# Patient Record
Sex: Male | Born: 1975 | Race: Black or African American | Hispanic: No | Marital: Married | Smoking: Never smoker
Health system: Southern US, Community
[De-identification: ages and names within clinical notes are randomized; demographics above are authoritative.]

## PROBLEM LIST (undated history)

## (undated) ENCOUNTER — Emergency Department (HOSPITAL_BASED_OUTPATIENT_CLINIC_OR_DEPARTMENT_OTHER): Payer: 59 | Source: Home / Self Care

## (undated) DIAGNOSIS — S86012A Strain of left Achilles tendon, initial encounter: Secondary | ICD-10-CM

## (undated) DIAGNOSIS — I82409 Acute embolism and thrombosis of unspecified deep veins of unspecified lower extremity: Secondary | ICD-10-CM

## (undated) HISTORY — PX: APPENDECTOMY: SHX54

---

## 2008-04-01 ENCOUNTER — Emergency Department (HOSPITAL_COMMUNITY): Admission: EM | Admit: 2008-04-01 | Discharge: 2008-04-01 | Payer: Self-pay | Admitting: Family Medicine

## 2010-09-29 ENCOUNTER — Emergency Department (INDEPENDENT_AMBULATORY_CARE_PROVIDER_SITE_OTHER): Payer: 59

## 2010-09-29 ENCOUNTER — Emergency Department (HOSPITAL_BASED_OUTPATIENT_CLINIC_OR_DEPARTMENT_OTHER)
Admission: EM | Admit: 2010-09-29 | Discharge: 2010-09-29 | Disposition: A | Discharge status: Home / Self Care | Payer: 59 | Attending: Emergency Medicine | Admitting: Emergency Medicine

## 2010-09-29 DIAGNOSIS — X500XXA Overexertion from strenuous movement or load, initial encounter: Secondary | ICD-10-CM

## 2010-09-29 DIAGNOSIS — Y9367 Activity, basketball: Secondary | ICD-10-CM | POA: Insufficient documentation

## 2010-09-29 DIAGNOSIS — W219XXA Striking against or struck by unspecified sports equipment, initial encounter: Secondary | ICD-10-CM | POA: Insufficient documentation

## 2010-09-29 DIAGNOSIS — S63659A Sprain of metacarpophalangeal joint of unspecified finger, initial encounter: Secondary | ICD-10-CM

## 2010-09-29 DIAGNOSIS — S6390XA Sprain of unspecified part of unspecified wrist and hand, initial encounter: Secondary | ICD-10-CM | POA: Insufficient documentation

## 2012-05-23 ENCOUNTER — Ambulatory Visit
Admission: RE | Admit: 2012-05-23 | Discharge: 2012-05-23 | Disposition: A | Discharge status: Home / Self Care | Payer: Worker's Compensation | Source: Ambulatory Visit | Attending: Family Medicine | Admitting: Family Medicine

## 2012-05-23 ENCOUNTER — Other Ambulatory Visit: Payer: Self-pay | Admitting: Family Medicine

## 2012-05-23 DIAGNOSIS — W19XXXA Unspecified fall, initial encounter: Secondary | ICD-10-CM

## 2014-04-01 ENCOUNTER — Encounter (HOSPITAL_BASED_OUTPATIENT_CLINIC_OR_DEPARTMENT_OTHER): Payer: Self-pay | Admitting: Emergency Medicine

## 2014-04-01 ENCOUNTER — Emergency Department (HOSPITAL_BASED_OUTPATIENT_CLINIC_OR_DEPARTMENT_OTHER)
Admission: EM | Admit: 2014-04-01 | Discharge: 2014-04-01 | Disposition: A | Discharge status: Home / Self Care | Payer: 59 | Attending: Emergency Medicine | Admitting: Emergency Medicine

## 2014-04-01 ENCOUNTER — Ambulatory Visit (HOSPITAL_BASED_OUTPATIENT_CLINIC_OR_DEPARTMENT_OTHER)
Admit: 2014-04-01 | Discharge: 2014-04-01 | Disposition: A | Discharge status: Home / Self Care | Payer: 59 | Source: Ambulatory Visit | Attending: Emergency Medicine | Admitting: Emergency Medicine

## 2014-04-01 DIAGNOSIS — M79604 Pain in right leg: Secondary | ICD-10-CM

## 2014-04-01 DIAGNOSIS — Z87828 Personal history of other (healed) physical injury and trauma: Secondary | ICD-10-CM | POA: Insufficient documentation

## 2014-04-01 DIAGNOSIS — M79609 Pain in unspecified limb: Secondary | ICD-10-CM | POA: Insufficient documentation

## 2014-04-01 DIAGNOSIS — Z86718 Personal history of other venous thrombosis and embolism: Secondary | ICD-10-CM | POA: Insufficient documentation

## 2014-04-01 DIAGNOSIS — Z7901 Long term (current) use of anticoagulants: Secondary | ICD-10-CM | POA: Diagnosis not present

## 2014-04-01 HISTORY — DX: Strain of left Achilles tendon, initial encounter: S86.012A

## 2014-04-01 HISTORY — DX: Acute embolism and thrombosis of unspecified deep veins of unspecified lower extremity: I82.409

## 2014-04-01 NOTE — ED Notes (Signed)
Pt currently being treated for blood clot in left calf(also has torn achilles tendon on left side and in cast. Pt now having pain in right upper quad with pain into his groin.

## 2014-04-01 NOTE — ED Provider Notes (Signed)
CSN: [id number]     Arrival date & time 04/01/14  0027 History   First MD Initiated Contact with Patient 04/01/14 0102     Chief Complaint  Patient presents with  . Leg Pain     (Consider location/radiation/quality/duration/timing/severity/associated sxs/prior Treatment) Patient is a 38 y.o. male presenting with leg pain. The history is provided by the patient.  Leg Pain Location:  Leg Injury: no   Leg location:  R leg Pain details:    Radiates to:  Does not radiate   Onset quality:  Gradual   Timing:  Constant   Progression:  Unchanged Chronicity:  New Foreign body present:  No foreign bodies Relieved by:  Nothing Associated symptoms: no muscle weakness   Risk factors: no obesity   Has achilles rupture on the left and is casted on the left and developed a DVT.  Study on the left was done at [facility].  Is on xarelto but now having medial thigh pain on the right and came because he wants a vascular study of the RLE.    Past Medical History  Diagnosis Date  . DVT (deep venous thrombosis)   . Achilles rupture, left    History reviewed. No pertinent past surgical history. History reviewed. No pertinent family history. History  Substance Use Topics  . Smoking status: Never Smoker   . Smokeless tobacco: Not on file  . Alcohol Use: No    Review of Systems  Respiratory: Negative for chest tightness and shortness of breath.   Cardiovascular: Negative for chest pain and palpitations.  Musculoskeletal: Positive for arthralgias.  All other systems reviewed and are negative.     Allergies  Review of patient's allergies indicates no known allergies.  Home Medications   Prior to Admission medications   Medication Sig Start Date End Date Taking? Authorizing Provider  Rivaroxaban (XARELTO) 15 MG TABS tablet Take 15 mg by mouth 2 (two) times daily with a meal.   Yes Historical Provider, MD   BP 140/65  Pulse 80  Temp(Src) 98.3 F (36.8 C) (Oral)  Resp 16  Wt 204 lb (92.534  kg)  SpO2 99% Physical Exam  Constitutional: He is oriented to person, place, and time. He appears well-developed and well-nourished. No distress.  HENT:  Head: Normocephalic and atraumatic.  Mouth/Throat: Oropharynx is clear and moist.  Eyes: Conjunctivae are normal. Pupils are equal, round, and reactive to light.  Neck: Normal range of motion. Neck supple.  Cardiovascular: Normal rate, regular rhythm and intact distal pulses.   Pulmonary/Chest: Effort normal and breath sounds normal. He has no wheezes. He has no rales.  Abdominal: Soft. Bowel sounds are normal. There is no tenderness.  Musculoskeletal: Normal range of motion. He exhibits no edema.  No cords of the RLE achilles intact  Neurological: He is alert and oriented to person, place, and time.  Skin: Skin is warm and dry.  Psychiatric: He has a normal mood and affect.    ED Course  Procedures (including critical care time) Labs Review Labs Reviewed - No data to display  Imaging Review No results found.   EKG Interpretation None      MDM   Final diagnoses:  Leg pain, diffuse, right   There is no indication for DDimer as patient has an active clot on the left.  Clotting studies to see if patient has a coagulopathy will be inconclusive as patient is on xarelto for more than 48 hours.   EDP offered lovenox as patient is concerned about  symptoms on xarelto.  Patient is refusing Lovenox.  Came in solely to get ultrasound of RLE.  EDP politely explained that there are not vascular ultrasounds at this hour and apologized that they had not been informed of this.  Outpatient vascular ultrasound scheduled for am but patient is still refusing lovenox.      Jasmine AweApril K Brittanni Cariker-Rasch, MD 04/01/14 [phone]

## 2014-04-11 ENCOUNTER — Ambulatory Visit: Payer: 59 | Attending: Orthopedic Surgery | Admitting: Physical Therapy

## 2014-04-18 ENCOUNTER — Ambulatory Visit: Payer: 59 | Attending: Orthopedic Surgery | Admitting: Rehabilitation

## 2014-04-18 DIAGNOSIS — M66369 Spontaneous rupture of flexor tendons, unspecified lower leg: Secondary | ICD-10-CM | POA: Insufficient documentation

## 2014-04-18 DIAGNOSIS — IMO0001 Reserved for inherently not codable concepts without codable children: Secondary | ICD-10-CM | POA: Insufficient documentation

## 2014-04-18 DIAGNOSIS — M25579 Pain in unspecified ankle and joints of unspecified foot: Secondary | ICD-10-CM | POA: Insufficient documentation

## 2014-04-18 DIAGNOSIS — M25673 Stiffness of unspecified ankle, not elsewhere classified: Secondary | ICD-10-CM | POA: Diagnosis not present

## 2014-04-18 DIAGNOSIS — M25676 Stiffness of unspecified foot, not elsewhere classified: Secondary | ICD-10-CM | POA: Insufficient documentation

## 2014-04-18 DIAGNOSIS — R269 Unspecified abnormalities of gait and mobility: Secondary | ICD-10-CM | POA: Insufficient documentation

## 2014-04-26 ENCOUNTER — Ambulatory Visit: Payer: 59 | Admitting: Rehabilitation

## 2014-04-27 ENCOUNTER — Ambulatory Visit: Payer: 59

## 2014-04-30 ENCOUNTER — Ambulatory Visit: Payer: 59 | Admitting: Rehabilitation

## 2014-04-30 DIAGNOSIS — IMO0001 Reserved for inherently not codable concepts without codable children: Secondary | ICD-10-CM | POA: Diagnosis not present

## 2014-05-03 ENCOUNTER — Ambulatory Visit: Payer: 59 | Admitting: Rehabilitation

## 2014-05-03 DIAGNOSIS — IMO0001 Reserved for inherently not codable concepts without codable children: Secondary | ICD-10-CM | POA: Diagnosis not present

## 2014-05-07 ENCOUNTER — Ambulatory Visit: Payer: 59 | Admitting: Physical Therapy

## 2014-05-07 DIAGNOSIS — IMO0001 Reserved for inherently not codable concepts without codable children: Secondary | ICD-10-CM | POA: Diagnosis not present

## 2014-05-10 ENCOUNTER — Ambulatory Visit: Payer: 59 | Admitting: Rehabilitation

## 2014-05-14 ENCOUNTER — Ambulatory Visit: Payer: 59 | Admitting: Physical Therapy

## 2014-05-14 DIAGNOSIS — IMO0001 Reserved for inherently not codable concepts without codable children: Secondary | ICD-10-CM | POA: Diagnosis not present

## 2014-05-17 ENCOUNTER — Ambulatory Visit: Payer: 59 | Attending: Orthopedic Surgery | Admitting: Rehabilitation

## 2014-05-17 DIAGNOSIS — M25672 Stiffness of left ankle, not elsewhere classified: Secondary | ICD-10-CM | POA: Insufficient documentation

## 2014-05-17 DIAGNOSIS — R269 Unspecified abnormalities of gait and mobility: Secondary | ICD-10-CM | POA: Insufficient documentation

## 2014-05-17 DIAGNOSIS — M25572 Pain in left ankle and joints of left foot: Secondary | ICD-10-CM | POA: Insufficient documentation

## 2014-05-17 DIAGNOSIS — M66872 Spontaneous rupture of other tendons, left ankle and foot: Secondary | ICD-10-CM | POA: Insufficient documentation

## 2014-05-21 ENCOUNTER — Ambulatory Visit: Payer: 59 | Admitting: Physical Therapy

## 2014-05-21 DIAGNOSIS — M66872 Spontaneous rupture of other tendons, left ankle and foot: Secondary | ICD-10-CM | POA: Diagnosis present

## 2014-05-21 DIAGNOSIS — R269 Unspecified abnormalities of gait and mobility: Secondary | ICD-10-CM | POA: Diagnosis not present

## 2014-05-21 DIAGNOSIS — M25572 Pain in left ankle and joints of left foot: Secondary | ICD-10-CM | POA: Diagnosis not present

## 2014-05-21 DIAGNOSIS — M25672 Stiffness of left ankle, not elsewhere classified: Secondary | ICD-10-CM | POA: Diagnosis not present

## 2014-05-24 ENCOUNTER — Ambulatory Visit: Payer: 59 | Admitting: Rehabilitation

## 2014-05-24 DIAGNOSIS — M66872 Spontaneous rupture of other tendons, left ankle and foot: Secondary | ICD-10-CM | POA: Diagnosis not present

## 2014-05-28 ENCOUNTER — Ambulatory Visit: Payer: 59 | Admitting: Rehabilitation

## 2014-05-28 DIAGNOSIS — M66872 Spontaneous rupture of other tendons, left ankle and foot: Secondary | ICD-10-CM | POA: Diagnosis not present

## 2014-05-31 ENCOUNTER — Ambulatory Visit: Payer: 59 | Admitting: Rehabilitation

## 2014-06-05 ENCOUNTER — Ambulatory Visit: Payer: 59 | Admitting: Rehabilitation

## 2014-06-07 ENCOUNTER — Ambulatory Visit: Payer: 59 | Admitting: Physical Therapy

## 2014-06-07 DIAGNOSIS — M66872 Spontaneous rupture of other tendons, left ankle and foot: Secondary | ICD-10-CM | POA: Diagnosis not present

## 2014-06-11 ENCOUNTER — Ambulatory Visit: Payer: 59 | Admitting: Rehabilitation

## 2014-06-11 DIAGNOSIS — M66872 Spontaneous rupture of other tendons, left ankle and foot: Secondary | ICD-10-CM | POA: Diagnosis not present

## 2014-06-14 ENCOUNTER — Ambulatory Visit: Payer: 59 | Admitting: Rehabilitation

## 2014-06-14 DIAGNOSIS — M66872 Spontaneous rupture of other tendons, left ankle and foot: Secondary | ICD-10-CM | POA: Diagnosis not present

## 2014-06-18 ENCOUNTER — Ambulatory Visit: Payer: 59 | Attending: Orthopedic Surgery | Admitting: Physical Therapy

## 2014-06-18 DIAGNOSIS — R269 Unspecified abnormalities of gait and mobility: Secondary | ICD-10-CM | POA: Insufficient documentation

## 2014-06-18 DIAGNOSIS — M66872 Spontaneous rupture of other tendons, left ankle and foot: Secondary | ICD-10-CM | POA: Insufficient documentation

## 2014-06-18 DIAGNOSIS — M25672 Stiffness of left ankle, not elsewhere classified: Secondary | ICD-10-CM | POA: Insufficient documentation

## 2014-06-18 DIAGNOSIS — M25572 Pain in left ankle and joints of left foot: Secondary | ICD-10-CM | POA: Insufficient documentation

## 2014-06-21 ENCOUNTER — Ambulatory Visit: Payer: 59 | Admitting: Rehabilitation

## 2014-06-21 DIAGNOSIS — M25672 Stiffness of left ankle, not elsewhere classified: Secondary | ICD-10-CM | POA: Diagnosis not present

## 2014-06-21 DIAGNOSIS — R269 Unspecified abnormalities of gait and mobility: Secondary | ICD-10-CM | POA: Diagnosis not present

## 2014-06-21 DIAGNOSIS — M66872 Spontaneous rupture of other tendons, left ankle and foot: Secondary | ICD-10-CM | POA: Diagnosis not present

## 2014-06-21 DIAGNOSIS — M25572 Pain in left ankle and joints of left foot: Secondary | ICD-10-CM | POA: Diagnosis not present

## 2014-06-25 ENCOUNTER — Ambulatory Visit: Payer: 59 | Admitting: Rehabilitation

## 2014-06-25 DIAGNOSIS — M66872 Spontaneous rupture of other tendons, left ankle and foot: Secondary | ICD-10-CM | POA: Diagnosis not present

## 2014-06-27 ENCOUNTER — Ambulatory Visit: Payer: 59 | Admitting: Physical Therapy

## 2014-06-28 ENCOUNTER — Ambulatory Visit: Payer: 59 | Admitting: Physical Therapy

## 2014-07-11 ENCOUNTER — Ambulatory Visit: Payer: 59 | Admitting: Physical Therapy

## 2014-08-07 ENCOUNTER — Ambulatory Visit: Payer: 59

## 2016-01-01 IMAGING — US US EXTREM LOW VENOUS*R*
1 series · 13 of 24 positions shown · non-contrast
Comparison: None.

CLINICAL DATA: Right thigh soreness. History of left lower
extremity deep venous thrombosis.



[Series 1: us extrem low venous*right* · 13 of 41 slices shown]
[im 1/41]
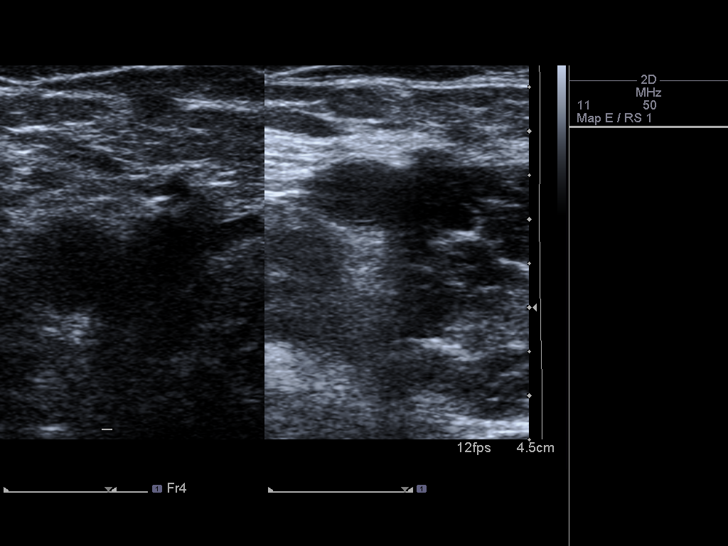
[im 4/41]
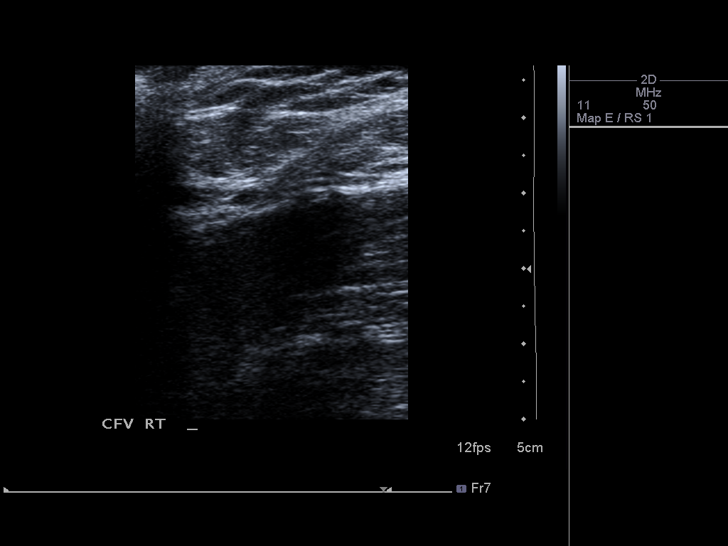
[im 7/41]
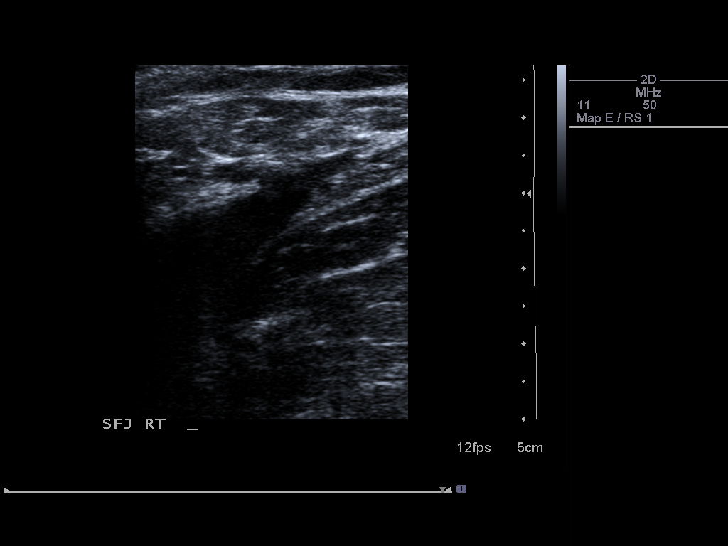
[im 11/41]
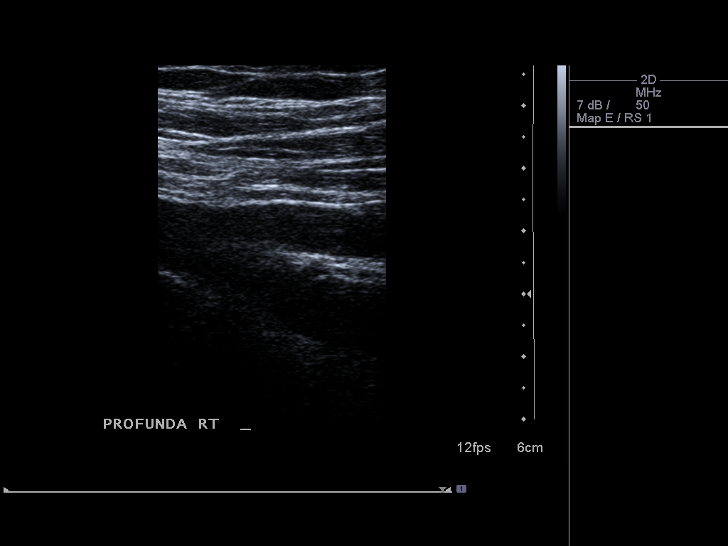
[im 14/41]
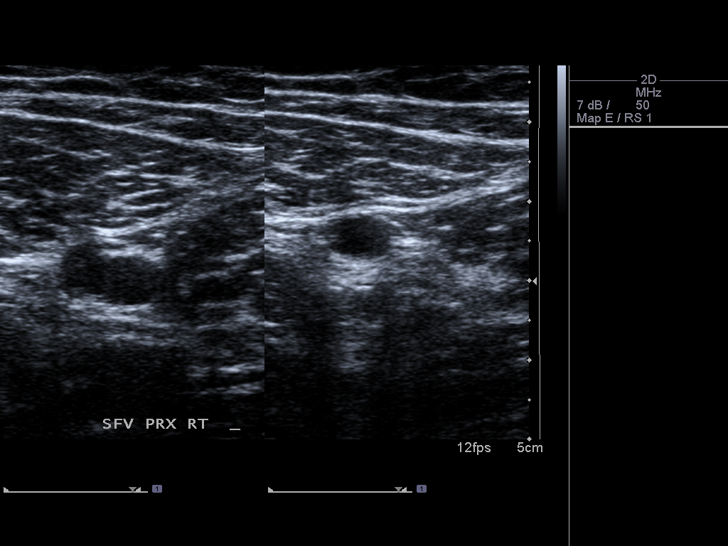
[im 18/41]
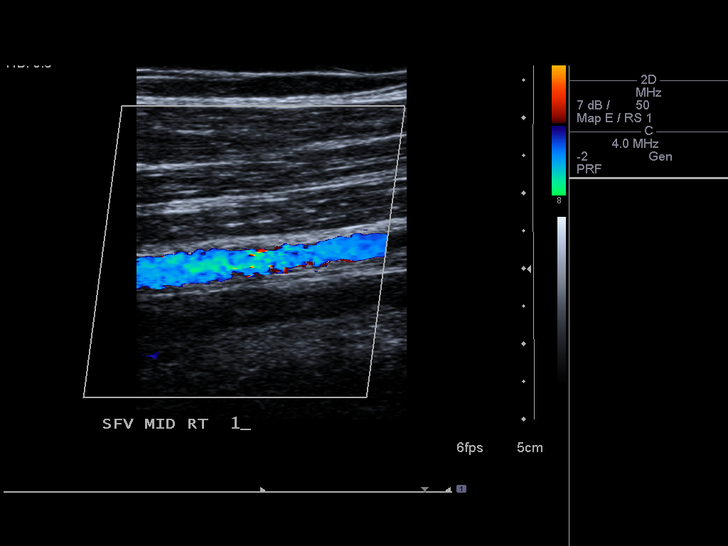
[im 21/41]
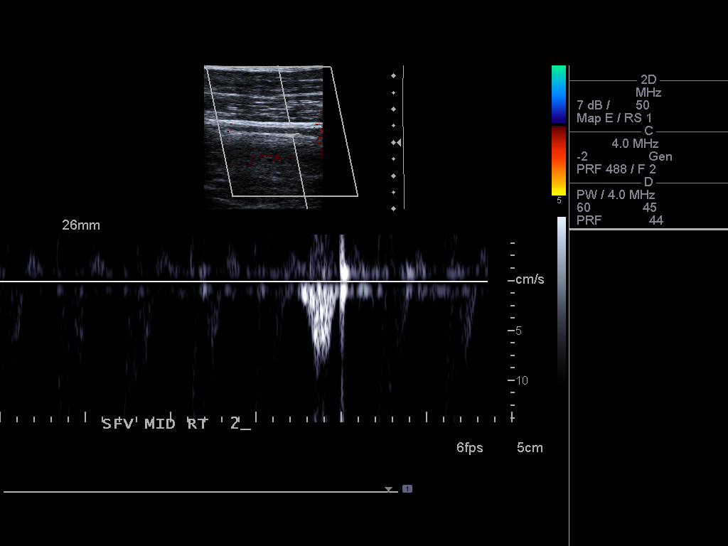
[im 23/41]
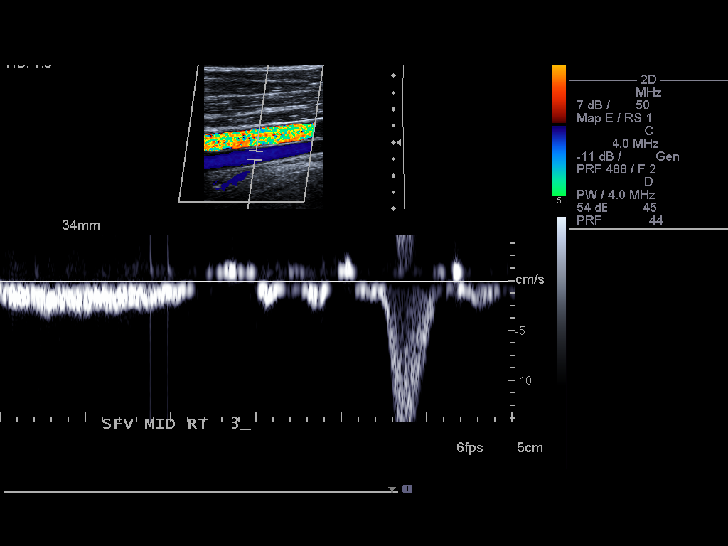
[im 27/41]
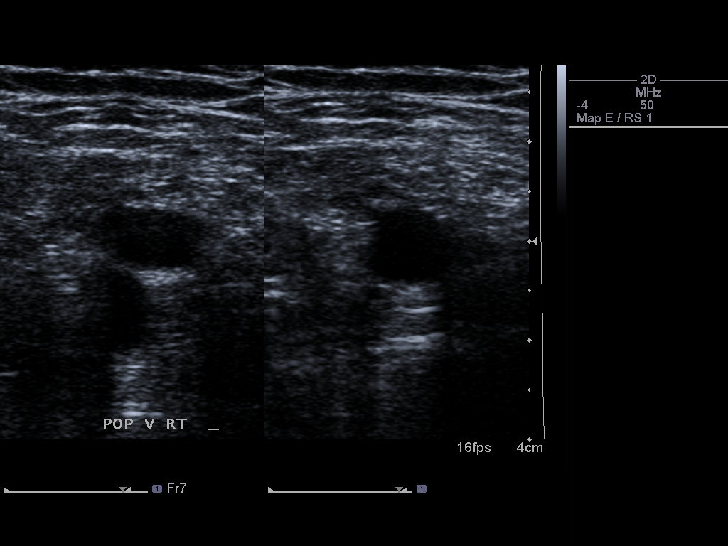
[im 30/41]
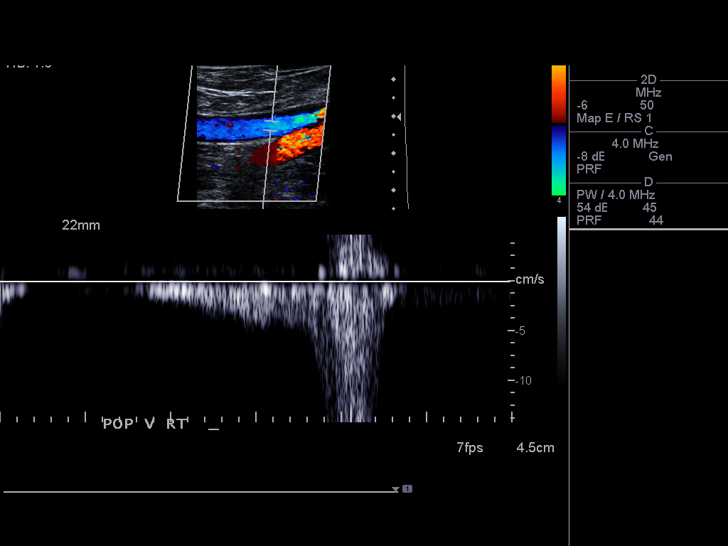
[im 34/41]
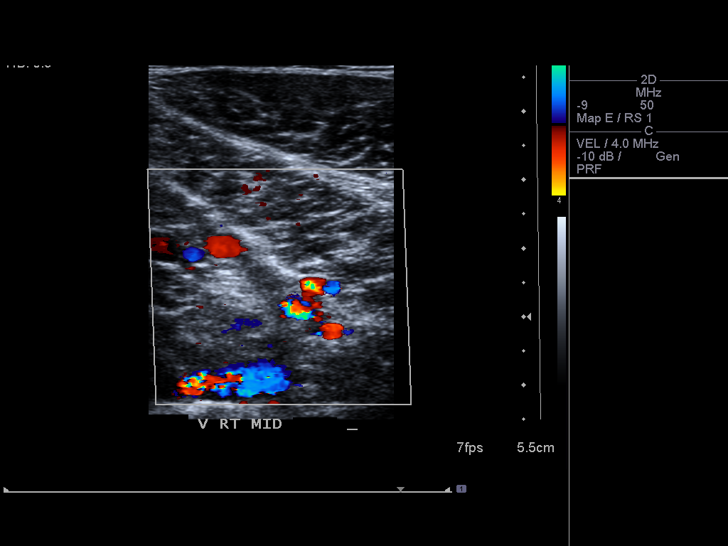
[im 37/41]
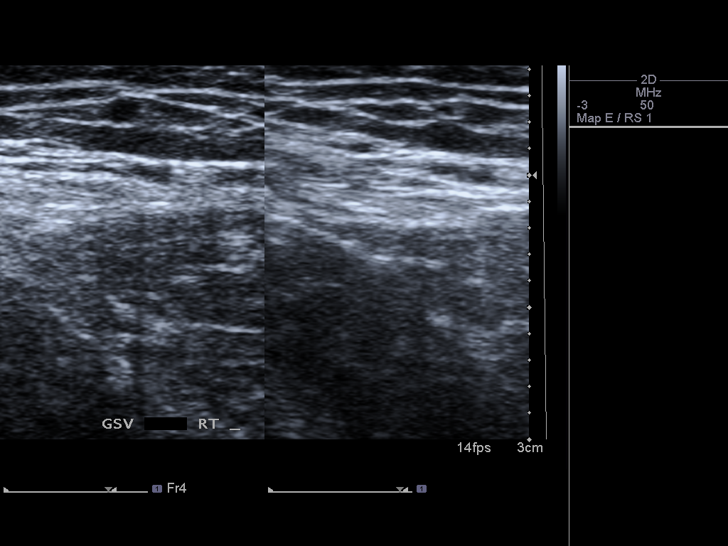
[im 41/41]
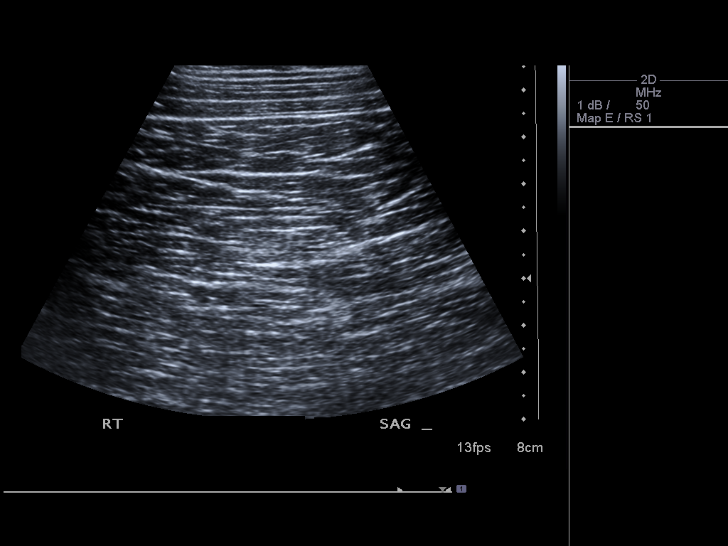

[13 of 24 positions shown; findings below may reference images not displayed]

FINDINGS: Common Femoral Vein: No evidence of thrombus. Normal
compressibility, respiratory phasicity and response to augmentation.

Saphenofemoral Junction: No evidence of thrombus. Normal
compressibility and flow on color Doppler imaging.

Profunda Femoral Vein: No evidence of thrombus. Normal
compressibility and flow on color Doppler imaging.

Femoral Vein: No evidence of thrombus. Normal compressibility,
respiratory phasicity and response to augmentation.

Popliteal Vein: No evidence of thrombus. Normal compressibility,
respiratory phasicity and response to augmentation.

Calf Veins: No evidence of thrombus. Normal compressibility and flow
on color Doppler imaging.

Superficial Great Saphenous Vein: No evidence of thrombus. Normal
compressibility and flow on color Doppler imaging.

Other Findings:  None.
IMPRESSION: No evidence of right lower extremity deep venous thrombosis.

## 2016-02-13 ENCOUNTER — Emergency Department (HOSPITAL_BASED_OUTPATIENT_CLINIC_OR_DEPARTMENT_OTHER): Payer: Commercial Managed Care - HMO

## 2016-02-13 ENCOUNTER — Encounter (HOSPITAL_BASED_OUTPATIENT_CLINIC_OR_DEPARTMENT_OTHER): Payer: Self-pay | Admitting: *Deleted

## 2016-02-13 ENCOUNTER — Emergency Department (HOSPITAL_BASED_OUTPATIENT_CLINIC_OR_DEPARTMENT_OTHER)
Admission: EM | Admit: 2016-02-13 | Discharge: 2016-02-13 | Disposition: A | Discharge status: Home / Self Care | Payer: Commercial Managed Care - HMO | Attending: Emergency Medicine | Admitting: Emergency Medicine

## 2016-02-13 DIAGNOSIS — N201 Calculus of ureter: Secondary | ICD-10-CM | POA: Insufficient documentation

## 2016-02-13 DIAGNOSIS — R001 Bradycardia, unspecified: Secondary | ICD-10-CM | POA: Diagnosis not present

## 2016-02-13 DIAGNOSIS — R109 Unspecified abdominal pain: Secondary | ICD-10-CM | POA: Diagnosis present

## 2016-02-13 LAB — BASIC METABOLIC PANEL
ANION GAP: 9 (ref 5–15)
BUN: 19 mg/dL (ref 6–20)
CALCIUM: 9 mg/dL (ref 8.9–10.3)
CHLORIDE: 101 mmol/L (ref 101–111)
CO2: 29 mmol/L (ref 22–32)
CREATININE: 1.5 mg/dL — AB (ref 0.61–1.24)
GFR calc non Af Amer: 58 mL/min — ABNORMAL LOW (ref >60)
Glucose, Bld: 160 mg/dL — ABNORMAL HIGH (ref 65–99)
Potassium: 3.4 mmol/L — ABNORMAL LOW (ref 3.5–5.1)
SODIUM: 139 mmol/L (ref 135–145)

## 2016-02-13 LAB — URINALYSIS, ROUTINE W REFLEX MICROSCOPIC
BILIRUBIN URINE: NEGATIVE
Glucose, UA: NEGATIVE mg/dL
Ketones, ur: 15 mg/dL — AB
LEUKOCYTES UA: NEGATIVE
NITRITE: NEGATIVE
Protein, ur: NEGATIVE mg/dL
SPECIFIC GRAVITY, URINE: 1.025 (ref 1.005–1.030)
pH: 6.5 (ref 5.0–8.0)

## 2016-02-13 LAB — CBC WITH DIFFERENTIAL/PLATELET
BASOS PCT: 0 %
Basophils Absolute: 0 10*3/uL (ref 0.0–0.1)
EOS ABS: 0 10*3/uL (ref 0.0–0.7)
Eosinophils Relative: 0 %
HCT: 39.7 % (ref 39.0–52.0)
HEMOGLOBIN: 13.4 g/dL (ref 13.0–17.0)
LYMPHS ABS: 1 10*3/uL (ref 0.7–4.0)
Lymphocytes Relative: 9 %
MCH: 27.4 pg (ref 26.0–34.0)
MCHC: 33.8 g/dL (ref 30.0–36.0)
MCV: 81.2 fL (ref 78.0–100.0)
Monocytes Absolute: 0.4 10*3/uL (ref 0.1–1.0)
Monocytes Relative: 3 %
NEUTROS PCT: 88 %
Neutro Abs: 9.5 10*3/uL — ABNORMAL HIGH (ref 1.7–7.7)
Platelets: 218 10*3/uL (ref 150–400)
RBC: 4.89 MIL/uL (ref 4.22–5.81)
RDW: 14.9 % (ref 11.5–15.5)
WBC: 10.9 10*3/uL — AB (ref 4.0–10.5)

## 2016-02-13 LAB — URINE MICROSCOPIC-ADD ON

## 2016-02-13 MED ORDER — SODIUM CHLORIDE 0.9 % IV BOLUS (SEPSIS)
1000.0000 mL | Freq: Once | INTRAVENOUS | Status: AC
  Administered 2016-02-13: 1000 mL via INTRAVENOUS

## 2016-02-13 MED ORDER — HYDROMORPHONE HCL 1 MG/ML IJ SOLN
1.0000 mg | Freq: Once | INTRAMUSCULAR | Status: AC
  Administered 2016-02-13: 1 mg via INTRAVENOUS
  Filled 2016-02-13: qty 1

## 2016-02-13 MED ORDER — ONDANSETRON HCL 4 MG/2ML IJ SOLN
4.0000 mg | Freq: Once | INTRAMUSCULAR | Status: AC
  Administered 2016-02-13: 4 mg via INTRAVENOUS
  Filled 2016-02-13: qty 2

## 2016-02-13 MED ORDER — ONDANSETRON HCL 4 MG PO TABS: 4.0000 mg | ORAL_TABLET | Freq: Four times a day (QID) | ORAL | Status: AC

## 2016-02-13 MED ORDER — OXYCODONE-ACETAMINOPHEN 5-325 MG PO TABS: 1.0000 | ORAL_TABLET | ORAL | Status: AC | PRN

## 2016-02-13 MED ORDER — KETOROLAC TROMETHAMINE 30 MG/ML IJ SOLN
30.0000 mg | Freq: Once | INTRAMUSCULAR | Status: AC
  Administered 2016-02-13: 30 mg via INTRAVENOUS
  Filled 2016-02-13: qty 1

## 2016-02-13 MED ORDER — MORPHINE SULFATE (PF) 4 MG/ML IV SOLN
4.0000 mg | Freq: Once | INTRAVENOUS | Status: AC
  Administered 2016-02-13: 4 mg via INTRAVENOUS
  Filled 2016-02-13: qty 1

## 2016-02-13 NOTE — Discharge Instructions (Signed)
Kidney Stones °Kidney stones (urolithiasis) are deposits that form inside your kidneys. The intense pain is caused by the stone moving through the urinary tract. When the stone moves, the ureter goes into spasm around the stone. The stone is usually passed in the urine.  °CAUSES  °· A disorder that makes certain neck glands produce too much parathyroid hormone (primary hyperparathyroidism). °· A buildup of uric acid crystals, similar to gout in your joints. °· Narrowing (stricture) of the ureter. °· A kidney obstruction present at birth (congenital obstruction). °· Previous surgery on the kidney or ureters. °· Numerous kidney infections. °SYMPTOMS  °· Feeling sick to your stomach (nauseous). °· Throwing up (vomiting). °· Blood in the urine (hematuria). °· Pain that usually spreads (radiates) to the groin. °· Frequency or urgency of urination. °DIAGNOSIS  °· Taking a history and physical exam. °· Blood or urine tests. °· CT scan. °· Occasionally, an examination of the inside of the urinary bladder (cystoscopy) is performed. °TREATMENT  °· Observation. °· Increasing your fluid intake. °· Extracorporeal shock wave lithotripsy--This is a noninvasive procedure that uses shock waves to break up kidney stones. °· Surgery may be needed if you have severe pain or persistent obstruction. There are various surgical procedures. Most of the procedures are performed with the use of small instruments. Only small incisions are needed to accommodate these instruments, so recovery time is minimized. °The size, location, and chemical composition are all important variables that will determine the proper choice of action for you. Talk to your health care provider to better understand your situation so that you will minimize the risk of injury to yourself and your kidney.  °HOME CARE INSTRUCTIONS  °· Drink enough water and fluids to keep your urine clear or pale yellow. This will help you to pass the stone or stone fragments. °· Strain  all urine through the provided strainer. Keep all particulate matter and stones for your health care provider to see. The stone causing the pain may be as small as a grain of salt. It is very important to use the strainer each and every time you pass your urine. The collection of your stone will allow your health care provider to analyze it and verify that a stone has actually passed. The stone analysis will often identify what you can do to reduce the incidence of recurrences. °· Only take over-the-counter or prescription medicines for pain, discomfort, or fever as directed by your health care provider. °· Keep all follow-up visits as told by your health care provider. This is important. °· Get follow-up X-rays if required. The absence of pain does not always mean that the stone has passed. It may have only stopped moving. If the urine remains completely obstructed, it can cause loss of kidney function or even complete destruction of the kidney. It is your responsibility to make sure X-rays and follow-ups are completed. Ultrasounds of the kidney can show blockages and the status of the kidney. Ultrasounds are not associated with any radiation and can be performed easily in a matter of minutes. °· Make changes to your daily diet as told by your health care provider. You may be told to: °¨ Limit the amount of salt that you eat. °¨ Eat 5 or more servings of fruits and vegetables each day. °¨ Limit the amount of meat, poultry, fish, and eggs that you eat. °· Collect a 24-hour urine sample as told by your health care provider. You may need to collect another urine sample every 6-12   months. °SEEK MEDICAL CARE IF: °· You experience pain that is progressive and unresponsive to any pain medicine you have been prescribed. °SEEK IMMEDIATE MEDICAL CARE IF:  °· Pain cannot be controlled with the prescribed medicine. °· You have a fever or shaking chills. °· The severity or intensity of pain increases over 18 hours and is not  relieved by pain medicine. °· You develop a new onset of abdominal pain. °· You feel faint or pass out. °· You are unable to urinate. °  °This information is not intended to replace advice given to you by your health care provider. Make sure you discuss any questions you have with your health care provider. °  °Document Released: 08/03/2005 Document Revised: 04/24/2015 Document Reviewed: 01/04/2013 °Elsevier Interactive Patient Education ©2016 [employer] ° °

## 2016-02-13 NOTE — ED Provider Notes (Signed)
CSN: [id number]     Arrival date & time 02/13/16  1731 History   First MD Initiated Contact with Patient 02/13/16 1742     Chief Complaint  Patient presents with  . Flank Pain   PT IS A 40 YO BM WITH A HX OF RIGHT SIDED FLANK PAIN SINCE YESTERDAY.  HE SAID THAT IT STOPPED, BUT THEN CAME BACK.  THE PT HAS HAD VOMITING.  PT SAID THE PAIN RADIATES INTO HIS RIGHT GROIN.  HE HAS NEVER HAD A KIDNEY STONE.  (Consider location/radiation/quality/duration/timing/severity/associated sxs/prior Treatment) Patient is a 40 y.o. male presenting with flank pain. The history is provided by the patient.  Flank Pain This is a new problem. The current episode started 12 to 24 hours ago. The problem occurs constantly. The problem has been rapidly worsening. Associated symptoms include abdominal pain.    Past Medical History  Diagnosis Date  . DVT (deep venous thrombosis) (HCC)   . Achilles rupture, left    History reviewed. No pertinent past surgical history. No family history on file. Social History  Substance Use Topics  . Smoking status: Never Smoker   . Smokeless tobacco: None  . Alcohol Use: No    Review of Systems  Gastrointestinal: Positive for vomiting and abdominal pain.  Genitourinary: Positive for flank pain.  All other systems reviewed and are negative.     Allergies  Review of patient's allergies indicates no known allergies.  Home Medications   Prior to Admission medications   Medication Sig Start Date End Date Taking? Authorizing Provider  ondansetron (ZOFRAN) 4 MG tablet Take 1 tablet (4 mg total) by mouth every 6 (six) hours. 02/13/16   Jacalyn LefevreJulie Eyan Hagood, MD  oxyCODONE-acetaminophen (PERCOCET/ROXICET) 5-325 MG tablet Take 1 tablet by mouth every 4 (four) hours as needed for severe pain. 02/13/16   Jacalyn LefevreJulie Lyndel Sarate, MD  Rivaroxaban (XARELTO) 15 MG TABS tablet Take 15 mg by mouth 2 (two) times daily with a meal.    Historical Provider, MD   BP 140/70 mmHg  Pulse 58  Temp(Src) 98.7  F (37.1 C) (Oral)  Resp 16  Ht 5' 10.5" (1.791 m)  Wt 210 lb (95.255 kg)  BMI 29.70 kg/m2  SpO2 99% Physical Exam  Constitutional: He is oriented to person, place, and time. He appears well-developed and well-nourished. He appears distressed.  HENT:  Head: Normocephalic and atraumatic.  Right Ear: External ear normal.  Left Ear: External ear normal.  Nose: Nose normal.  Mouth/Throat: Oropharynx is clear and moist.  Eyes: Conjunctivae and EOM are normal. Pupils are equal, round, and reactive to light.  Neck: Normal range of motion. Neck supple.  Cardiovascular: Regular rhythm, normal heart sounds and intact distal pulses.  Bradycardia present.   Pulmonary/Chest: Effort normal and breath sounds normal.  Abdominal: Bowel sounds are normal. There is tenderness.  Musculoskeletal: Normal range of motion.  Neurological: He is alert and oriented to person, place, and time.  Skin: Skin is warm and dry.  Psychiatric: He has a normal mood and affect. His behavior is normal. Judgment and thought content normal.  Nursing note and vitals reviewed.   ED Course  Procedures (including critical care time) Labs Review Labs Reviewed  URINALYSIS, ROUTINE W REFLEX MICROSCOPIC (NOT AT [facility]) - Abnormal; Notable for the following:    Hgb urine dipstick LARGE (*)    Ketones, ur 15 (*)    All other components within normal limits  BASIC METABOLIC PANEL - Abnormal; Notable for the following:    Potassium  3.4 (*)    Glucose, Bld 160 (*)    Creatinine, Ser 1.50 (*)    GFR calc non Af Amer 58 (*)    All other components within normal limits  CBC WITH DIFFERENTIAL/PLATELET - Abnormal; Notable for the following:    WBC 10.9 (*)    Neutro Abs 9.5 (*)    All other components within normal limits  URINE MICROSCOPIC-ADD ON - Abnormal; Notable for the following:    Squamous Epithelial / LPF 0-5 (*)    Bacteria, UA RARE (*)    All other components within normal limits    Imaging Review Ct Renal Stone  Study  02/13/2016  CLINICAL DATA:  Right flank pain for 1 day.  Nausea and vomiting EXAM: CT ABDOMEN AND PELVIS WITHOUT CONTRAST TECHNIQUE: Multidetector CT imaging of the abdomen and pelvis was performed following the standard protocol without oral or intravenous contrast material administration. COMPARISON:  None. FINDINGS: Lower chest: There is slight bibasilar lung atelectatic change. Lung bases otherwise are clear. Hepatobiliary: No focal liver lesions are evident on this noncontrast enhanced study. Gallbladder wall is not appreciably thickened. There is no biliary duct dilatation. Pancreas: No pancreatic mass or inflammatory focus. Spleen: No splenic lesions are evident. Adrenals/Urinary Tract: Adrenals appear normal bilaterally. There is no appreciable renal mass on either side. The right kidney is subtly edematous. There is mild hydronephrosis on the right. There is no hydronephrosis there is a 3 x 2 mm calculus at the right ureterovesical junction. No ureteral calculi are identified on the left. The urinary bladder is midline with wall thickness within normal limits. Stomach/Bowel: There is no bowel wall or mesenteric thickening. There is no appreciable bowel obstruction. No free air or portal venous air. Vascular/Lymphatic: There is no abdominal aortic aneurysm. No vascular lesions are evident on this noncontrast enhanced study. There are several scattered subcentimeter mesenteric lymph nodes in the right abdomen. By size criteria, there is no adenopathy in the abdomen or pelvis. Reproductive: Prostate and seminal vesicles appear normal. There is no pelvic mass or pelvic fluid collection. Other: There is no periappendiceal region inflammation. No abscess or ascites evident. Musculoskeletal: There are no blastic or lytic bone lesions. Calcification along the superior lateral left acetabulum probably represents localized calcific tendinosis. A smaller focus of similar calcification is noted in this area on  the right. There is no intramuscular or abdominal wall lesion. IMPRESSION: 3 x 2 mm calculus right ureterovesical junction with mild hydronephrosis on the right. 1 mm nonobstructing calculus mid right kidney. No bowel obstruction. No abscess. No periappendiceal region inflammation. Occasional subcentimeter lymph nodes in the right abdomen regarded as nonspecific. By size criteria, there is no adenopathy in the abdomen or pelvis. Electronically Signed   By: Bretta BangWilliam  Woodruff III M.D.   On: 02/13/2016 18:53   I have personally reviewed and evaluated these images and lab results as part of my medical decision-making.   EKG Interpretation None      MDM  PT'S PAIN HAS RESOLVED.  PT KNOWS TO RETURN IF WORSE. Final diagnoses:  Right ureteral stone        Jacalyn LefevreJulie Tatiana Courter, MD 02/13/16 1931

## 2016-02-13 NOTE — ED Notes (Signed)
Right flank pain since yesterday. Vomiting.

## 2017-11-14 IMAGING — CT CT RENAL STONE PROTOCOL
2 of 4 series · 16 of 46 positions shown, 18 images · non-contrast
Comparison: None.

CLINICAL DATA: Right flank pain for 1 day.  Nausea and vomiting

EXAM:
CT ABDOMEN AND PELVIS WITHOUT CONTRAST
TECHNIQUE: Multidetector CT imaging of the abdomen and pelvis was performed
following the standard protocol without oral or intravenous contrast
material administration.

[Series 2: axial st · axial · 0.86mm/px · z∈[-511,-71]mm · 13 of 97 slices shown, 15 images]
[im 5/97  soft-tissue]
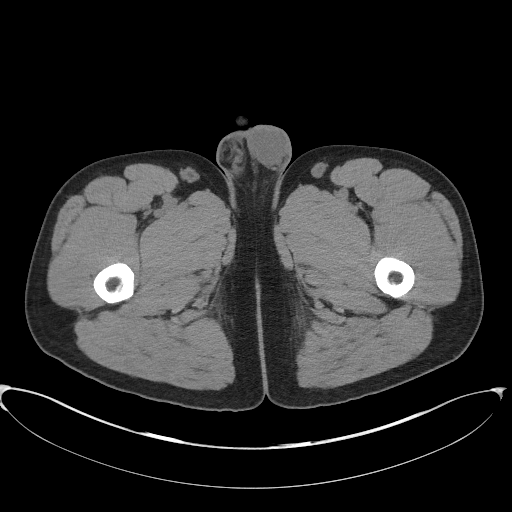
[im 5/97  bone]
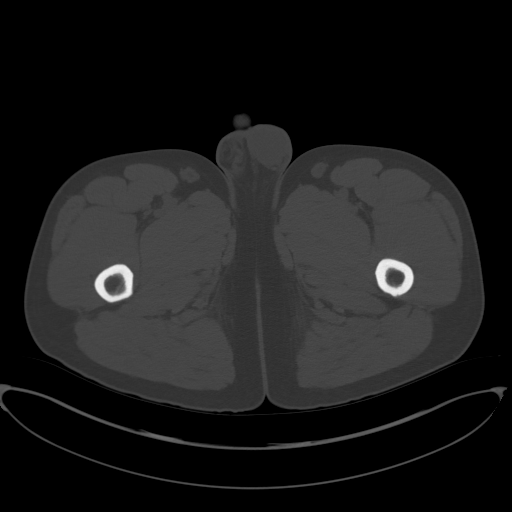
[im 13/97  soft-tissue]
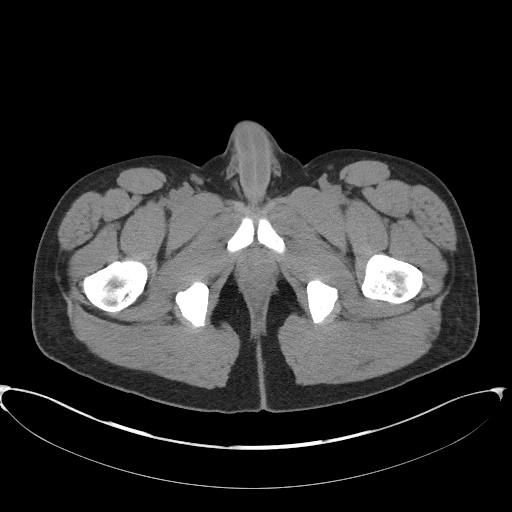
[im 21/97  soft-tissue]
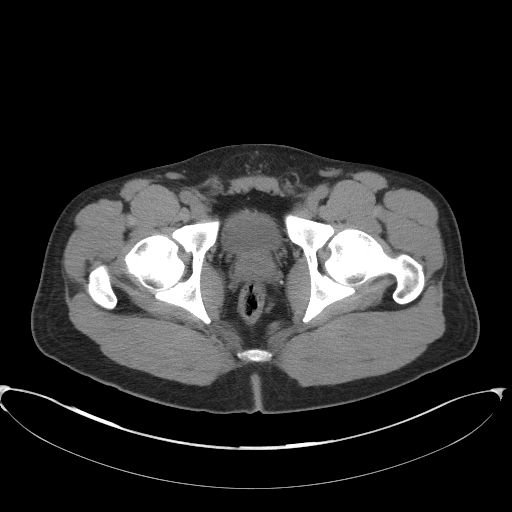
[im 29/97  soft-tissue]
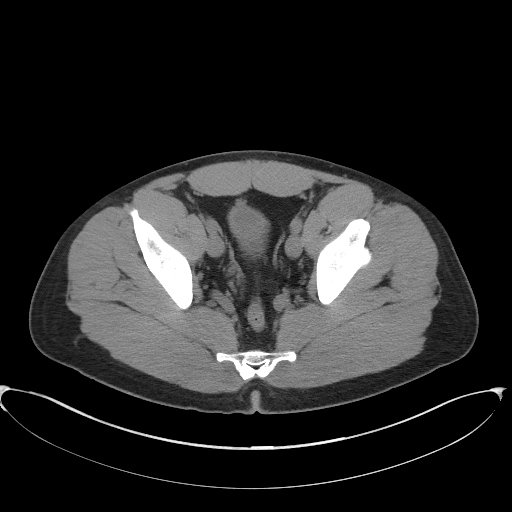
[im 33/97  soft-tissue]
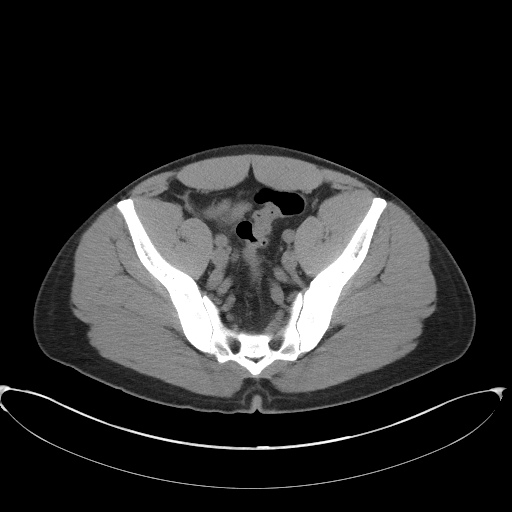
[im 41/97  soft-tissue]
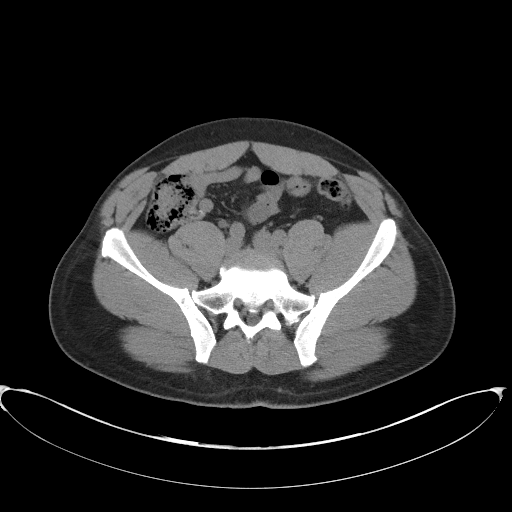
[im 49/97  soft-tissue]
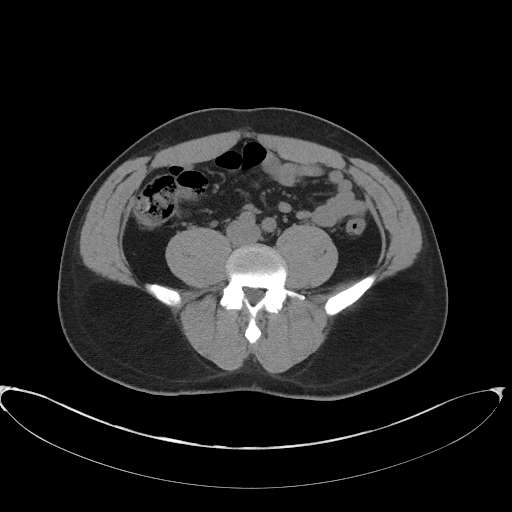
[im 57/97  soft-tissue]
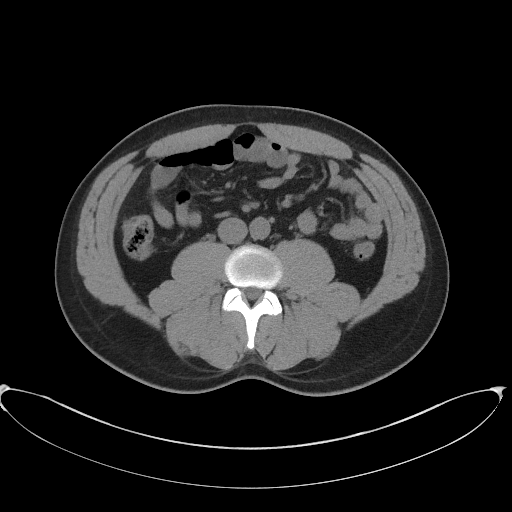
[im 65/97  soft-tissue]
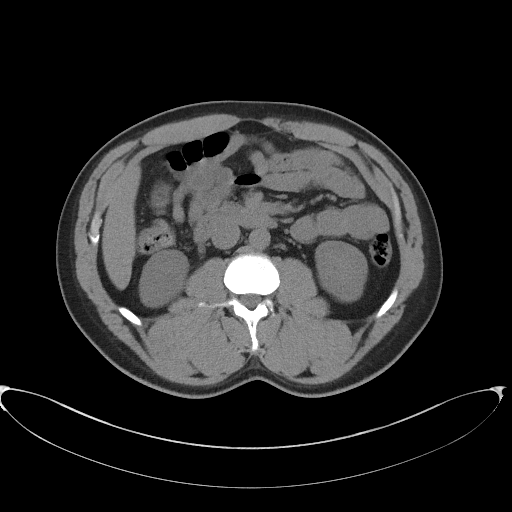
[im 65/97  bone]
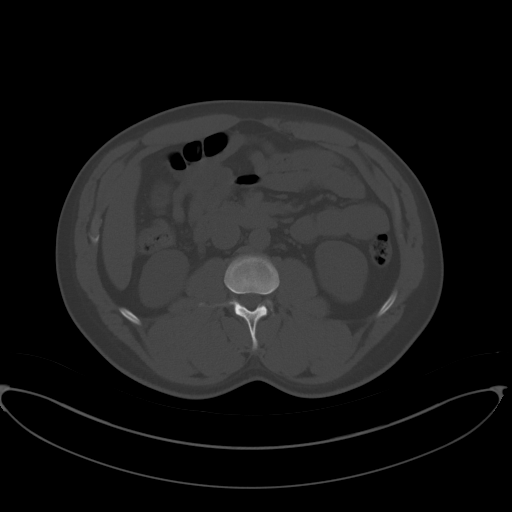
[im 69/97  soft-tissue]
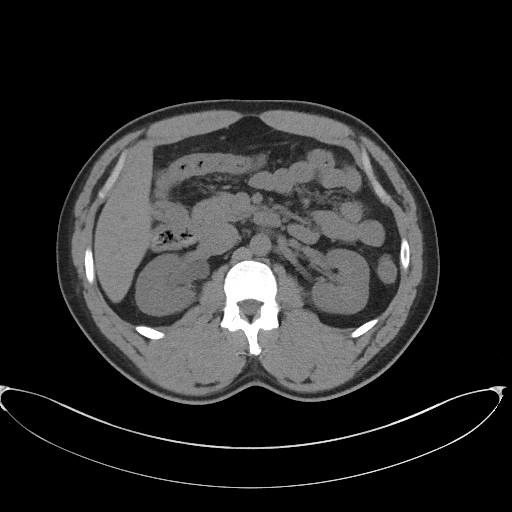
[im 77/97  soft-tissue]
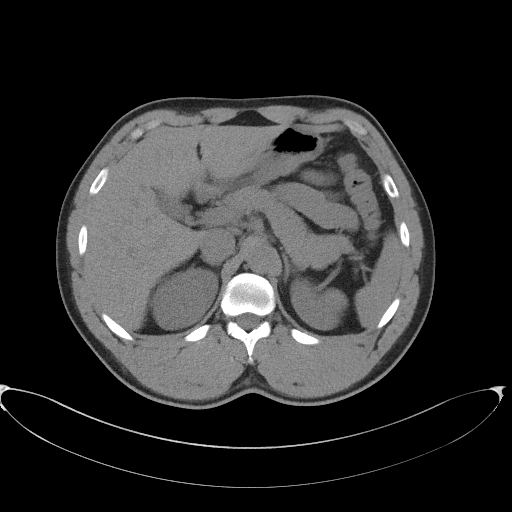
[im 85/97  soft-tissue]
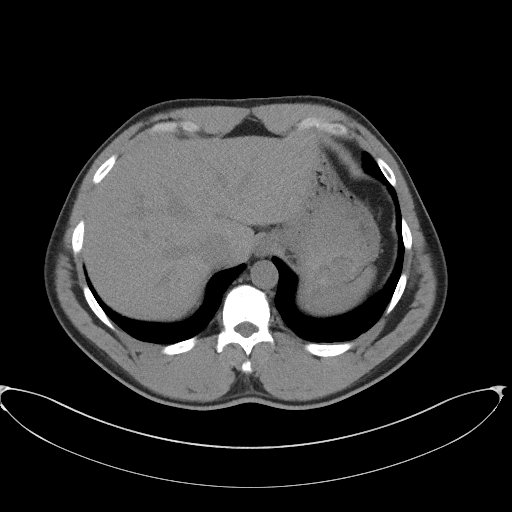
[im 93/97  soft-tissue]
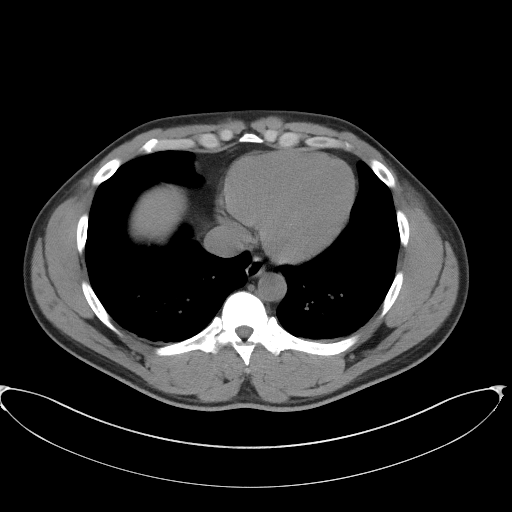

[Series 5: coronal st · coronal · 0.86mm/px · 3 of 88 slices shown]
[im 30/88  soft-tissue]
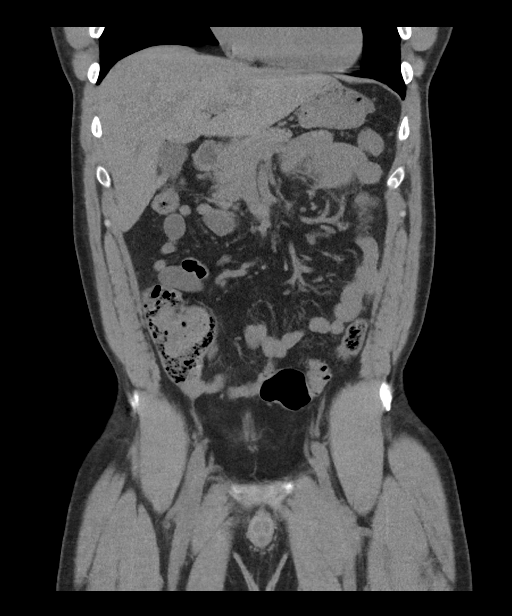
[im 39/88  soft-tissue]
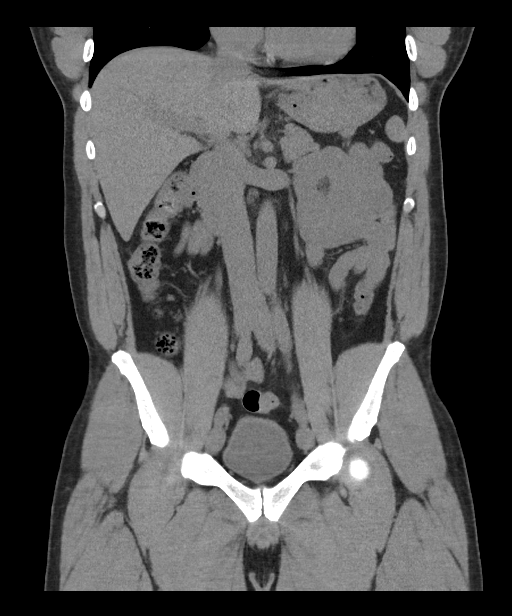
[im 49/88  soft-tissue]
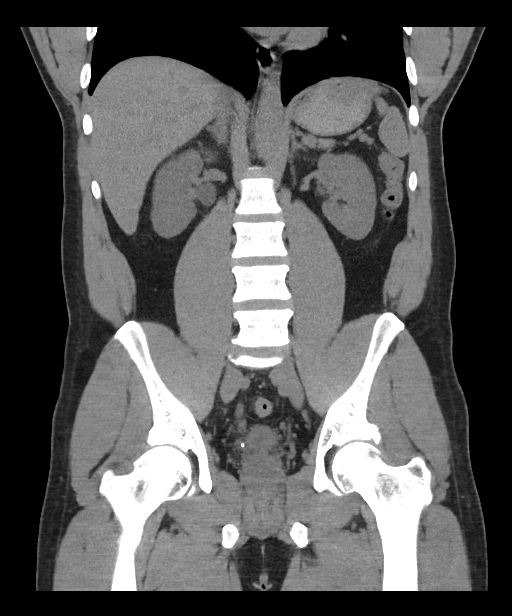

[16 of 46 positions shown; findings below may reference images not displayed]

FINDINGS: Lower chest: There is slight bibasilar lung atelectatic change. Lung
bases otherwise are clear.

Hepatobiliary: No focal liver lesions are evident on this
noncontrast enhanced study. Gallbladder wall is not appreciably
thickened. There is no biliary duct dilatation.

Pancreas: No pancreatic mass or inflammatory focus.

Spleen: No splenic lesions are evident.

Adrenals/Urinary Tract: Adrenals appear normal bilaterally. There is
no appreciable renal mass on either side. The right kidney is subtly
edematous. There is mild hydronephrosis on the right. There is no
hydronephrosis there is a 3 x 2 mm calculus at the right
ureterovesical junction. No ureteral calculi are identified on the
left. The urinary bladder is midline with wall thickness within
normal limits.

Stomach/Bowel: There is no bowel wall or mesenteric thickening.
There is no appreciable bowel obstruction. No free air or portal
venous air.

Vascular/Lymphatic: There is no abdominal aortic aneurysm. No
vascular lesions are evident on this noncontrast enhanced study.
There are several scattered subcentimeter mesenteric lymph nodes in
the right abdomen. By size criteria, there is no adenopathy in the
abdomen or pelvis.

Reproductive: Prostate and seminal vesicles appear normal. There is
no pelvic mass or pelvic fluid collection.

Other: There is no periappendiceal region inflammation. No abscess
or ascites evident.

Musculoskeletal: There are no blastic or lytic bone lesions.
Calcification along the superior lateral left acetabulum probably
represents localized calcific tendinosis. A smaller focus of similar
calcification is noted in this area on the right. There is no
intramuscular or abdominal wall lesion.
IMPRESSION: 3 x 2 mm calculus right ureterovesical junction with mild
hydronephrosis on the right. 1 mm nonobstructing calculus mid right
kidney.

No bowel obstruction. No abscess. No periappendiceal region
inflammation. Occasional subcentimeter lymph nodes in the right
abdomen regarded as nonspecific. By size criteria, there is no
adenopathy in the abdomen or pelvis.

## 2022-02-20 ENCOUNTER — Encounter (HOSPITAL_BASED_OUTPATIENT_CLINIC_OR_DEPARTMENT_OTHER): Payer: Self-pay | Admitting: Emergency Medicine

## 2022-02-20 ENCOUNTER — Emergency Department (HOSPITAL_BASED_OUTPATIENT_CLINIC_OR_DEPARTMENT_OTHER): Payer: 59

## 2022-02-20 ENCOUNTER — Emergency Department (HOSPITAL_BASED_OUTPATIENT_CLINIC_OR_DEPARTMENT_OTHER)
Admission: EM | Admit: 2022-02-20 | Discharge: 2022-02-21 | Disposition: A | Discharge status: Home / Self Care | Payer: 59 | Attending: Emergency Medicine | Admitting: Emergency Medicine

## 2022-02-20 ENCOUNTER — Other Ambulatory Visit: Payer: Self-pay

## 2022-02-20 DIAGNOSIS — R112 Nausea with vomiting, unspecified: Secondary | ICD-10-CM | POA: Insufficient documentation

## 2022-02-20 DIAGNOSIS — R197 Diarrhea, unspecified: Secondary | ICD-10-CM | POA: Diagnosis not present

## 2022-02-20 DIAGNOSIS — Z20822 Contact with and (suspected) exposure to covid-19: Secondary | ICD-10-CM | POA: Insufficient documentation

## 2022-02-20 DIAGNOSIS — R1084 Generalized abdominal pain: Secondary | ICD-10-CM | POA: Insufficient documentation

## 2022-02-20 LAB — URINALYSIS, ROUTINE W REFLEX MICROSCOPIC
Bilirubin Urine: NEGATIVE
Glucose, UA: NEGATIVE mg/dL
Ketones, ur: NEGATIVE mg/dL
Leukocytes,Ua: NEGATIVE
Nitrite: NEGATIVE
Protein, ur: NEGATIVE mg/dL
Specific Gravity, Urine: >=1.03 (ref 1.005–1.030)
pH: 6 (ref 5.0–8.0)

## 2022-02-20 LAB — CBC WITH DIFFERENTIAL/PLATELET
Abs Immature Granulocytes: 0.03 10*3/uL (ref 0.00–0.07)
Basophils Absolute: 0.1 10*3/uL (ref 0.0–0.1)
Basophils Relative: 0 %
Eosinophils Absolute: 0.1 10*3/uL (ref 0.0–0.5)
Eosinophils Relative: 1 %
HCT: 44.1 % (ref 39.0–52.0)
Hemoglobin: 14.1 g/dL (ref 13.0–17.0)
Immature Granulocytes: 0 %
Lymphocytes Relative: 9 %
Lymphs Abs: 1 10*3/uL (ref 0.7–4.0)
MCH: 26.6 pg (ref 26.0–34.0)
MCHC: 32 g/dL (ref 30.0–36.0)
MCV: 83.2 fL (ref 80.0–100.0)
Monocytes Absolute: 0.4 10*3/uL (ref 0.1–1.0)
Monocytes Relative: 3 %
Neutro Abs: 10.6 10*3/uL — ABNORMAL HIGH (ref 1.7–7.7)
Neutrophils Relative %: 87 %
Platelets: 241 10*3/uL (ref 150–400)
RBC: 5.3 MIL/uL (ref 4.22–5.81)
RDW: 15.8 % — ABNORMAL HIGH (ref 11.5–15.5)
WBC: 12.3 10*3/uL — ABNORMAL HIGH (ref 4.0–10.5)
nRBC: 0 % (ref 0.0–0.2)

## 2022-02-20 LAB — LIPASE, BLOOD: Lipase: 32 U/L (ref 11–51)

## 2022-02-20 LAB — COMPREHENSIVE METABOLIC PANEL
ALT: 32 U/L (ref 0–44)
AST: 38 U/L (ref 15–41)
Albumin: 4.3 g/dL (ref 3.5–5.0)
Alkaline Phosphatase: 38 U/L (ref 38–126)
Anion gap: 7 (ref 5–15)
BUN: 22 mg/dL — ABNORMAL HIGH (ref 6–20)
CO2: 30 mmol/L (ref 22–32)
Calcium: 9.6 mg/dL (ref 8.9–10.3)
Chloride: 101 mmol/L (ref 98–111)
Creatinine, Ser: 1.55 mg/dL — ABNORMAL HIGH (ref 0.61–1.24)
GFR, Estimated: 57 mL/min — ABNORMAL LOW (ref >60)
Glucose, Bld: 137 mg/dL — ABNORMAL HIGH (ref 70–99)
Potassium: 3.9 mmol/L (ref 3.5–5.1)
Sodium: 138 mmol/L (ref 135–145)
Total Bilirubin: 0.5 mg/dL (ref 0.3–1.2)
Total Protein: 8.3 g/dL — ABNORMAL HIGH (ref 6.5–8.1)

## 2022-02-20 LAB — URINALYSIS, MICROSCOPIC (REFLEX): WBC, UA: NONE SEEN WBC/hpf (ref 0–5)

## 2022-02-20 MED ORDER — SODIUM CHLORIDE 0.9 % IV BOLUS
500.0000 mL | Freq: Once | INTRAVENOUS | Status: AC
  Administered 2022-02-21: 500 mL via INTRAVENOUS

## 2022-02-20 NOTE — ED Triage Notes (Signed)
Generalized abdominal pain and n/v/d today. Seen by GI and dx with diverticulosis. Also endorses right scrotum pain today. Denies fevers, urinary sx, penile discharge.

## 2022-02-21 ENCOUNTER — Emergency Department (HOSPITAL_BASED_OUTPATIENT_CLINIC_OR_DEPARTMENT_OTHER): Payer: 59

## 2022-02-21 LAB — SARS CORONAVIRUS 2 BY RT PCR: SARS Coronavirus 2 by RT PCR: NEGATIVE

## 2022-02-21 MED ORDER — ALUM & MAG HYDROXIDE-SIMETH 200-200-20 MG/5ML PO SUSP
30.0000 mL | Freq: Once | ORAL | Status: AC
  Administered 2022-02-21: 30 mL via ORAL
  Filled 2022-02-21: qty 30

## 2022-02-21 MED ORDER — OMEPRAZOLE 20 MG PO CPDR: 20.0000 mg | DELAYED_RELEASE_CAPSULE | Freq: Every day | ORAL | 0 refills | Status: AC

## 2022-02-21 MED ORDER — ONDANSETRON 8 MG PO TBDP: ORAL_TABLET | ORAL | 0 refills | Status: AC

## 2022-02-21 MED ORDER — ONDANSETRON HCL 4 MG/2ML IJ SOLN
4.0000 mg | Freq: Once | INTRAMUSCULAR | Status: AC
Start: 2022-02-21 — End: 2022-02-21
  Administered 2022-02-21: 4 mg via INTRAVENOUS
  Filled 2022-02-21: qty 2

## 2022-02-21 MED ORDER — LIDOCAINE 5 % EX PTCH: 3.0000 | MEDICATED_PATCH | CUTANEOUS | Status: DC

## 2022-02-21 MED ORDER — KETOROLAC TROMETHAMINE 30 MG/ML IJ SOLN
30.0000 mg | Freq: Once | INTRAMUSCULAR | Status: AC
  Administered 2022-02-21: 30 mg via INTRAVENOUS
  Filled 2022-02-21: qty 1

## 2022-02-21 NOTE — ED Provider Notes (Signed)
[facility] EMERGENCY DEPARTMENT Provider Note   CSN: [id number] Arrival date & time: 02/20/22  2125     History  Chief Complaint  Patient presents with   Abdominal Pain   Emesis   Diarrhea    Michael Wallace is a 46 y.o. male.  The history is provided by the patient.  Abdominal Pain Pain location:  Generalized Pain quality: bloating   Pain radiates to:  Does not radiate Pain severity:  Moderate Onset quality:  Gradual Duration: chronic for more than a year. Timing:  Constant Progression:  Waxing and waning Chronicity:  Chronic Context: not alcohol use   Relieved by:  Nothing Worsened by:  Nothing Ineffective treatments:  None tried Associated symptoms: nausea and vomiting   Associated symptoms: no constipation, no diarrhea and no fever   Risk factors: no alcohol abuse   Emesis Severity:  Moderate Duration: hours. Timing:  Sporadic Number of daily episodes:  8 Quality:  Stomach contents Progression:  Unchanged Chronicity:  New Recent urination:  Normal Relieved by:  Nothing Worsened by:  Nothing Associated symptoms: abdominal pain   Associated symptoms: no diarrhea and no fever   Risk factors: no alcohol use        Home Medications Prior to Admission medications   Medication Sig Start Date End Date Taking? Authorizing Provider  omeprazole (PRILOSEC) 20 MG capsule Take 1 capsule (20 mg total) by mouth daily. 02/21/22  Yes Katisha Shimizu, MD  ondansetron (ZOFRAN-ODT) 8 MG disintegrating tablet 8mg  ODT q8hours prn nausea 02/21/22  Yes Regan Llorente, MD  ondansetron (ZOFRAN) 4 MG tablet Take 1 tablet (4 mg total) by mouth every 6 (six) hours. 02/13/16   02/15/16, MD  oxyCODONE-acetaminophen (PERCOCET/ROXICET) 5-325 MG tablet Take 1 tablet by mouth every 4 (four) hours as needed for severe pain. 02/13/16   02/15/16, MD  Rivaroxaban (XARELTO) 15 MG TABS tablet Take 15 mg by mouth 2 (two) times daily with a meal.    [provider]       Allergies    Patient has no known allergies.    Review of Systems   Review of Systems  Constitutional:  Negative for fever.  HENT:  Negative for congestion.   Eyes:  Negative for redness.  Gastrointestinal:  Positive for abdominal pain, nausea and vomiting. Negative for constipation and diarrhea.  All other systems reviewed and are negative.   Physical Exam Updated Vital Signs BP 120/76   Pulse 65   Temp 97.8 F (36.6 C) (Oral)   Resp 18   Wt 99.8 kg   SpO2 97%   BMI 31.12 kg/m  Physical Exam Vitals and nursing note reviewed.  Constitutional:      General: He is not in acute distress.    Appearance: Normal appearance. He is well-developed. He is not diaphoretic.  HENT:     Head: Normocephalic and atraumatic.     Nose: Nose normal.  Eyes:     Conjunctiva/sclera: Conjunctivae normal.     Pupils: Pupils are equal, round, and reactive to light.  Cardiovascular:     Rate and Rhythm: Normal rate and regular rhythm.     Pulses: Normal pulses.     Heart sounds: Normal heart sounds.  Pulmonary:     Effort: Pulmonary effort is normal.     Breath sounds: Normal breath sounds. No wheezing or rales.  Abdominal:     General: Abdomen is flat.     Palpations: Abdomen is soft.     Tenderness:  There is no abdominal tenderness. There is no guarding or rebound.     Comments: Hyperactive BS throughout   Musculoskeletal:        General: Normal range of motion.     Cervical back: Normal range of motion and neck supple.  Skin:    General: Skin is warm and dry.     Capillary Refill: Capillary refill takes less than 2 seconds.  Neurological:     General: No focal deficit present.     Mental Status: He is alert and oriented to person, place, and time.     Deep Tendon Reflexes: Reflexes normal.  Psychiatric:        Mood and Affect: Mood normal.        Behavior: Behavior normal.     ED Results / Procedures / Treatments   Labs (all labs ordered are listed, but only abnormal  results are displayed) Labs Reviewed  COMPREHENSIVE METABOLIC PANEL - Abnormal; Notable for the following components:      Result Value   Glucose, Bld 137 (*)    BUN 22 (*)    Creatinine, Ser 1.55 (*)    Total Protein 8.3 (*)    GFR, Estimated 57 (*)    All other components within normal limits  URINALYSIS, ROUTINE W REFLEX MICROSCOPIC - Abnormal; Notable for the following components:   Hgb urine dipstick TRACE (*)    All other components within normal limits  CBC WITH DIFFERENTIAL/PLATELET - Abnormal; Notable for the following components:   WBC 12.3 (*)    RDW 15.8 (*)    Neutro Abs 10.6 (*)    All other components within normal limits  URINALYSIS, MICROSCOPIC (REFLEX) - Abnormal; Notable for the following components:   Bacteria, UA RARE (*)    All other components within normal limits  SARS CORONAVIRUS 2 BY RT PCR  LIPASE, BLOOD    EKG None  Radiology DG Chest 1 View  Result Date: 02/21/2022 CLINICAL DATA:  Abdomen pain with nausea vomiting and diarrhea EXAM: CHEST  1 VIEW COMPARISON:  None Available. FINDINGS: The heart size and mediastinal contours are within normal limits. Both lungs are clear. The visualized skeletal structures are unremarkable. IMPRESSION: No active disease. Electronically Signed   By: Jasmine Pang M.D.   On: 02/21/2022 01:17   CT Renal Stone Study  Result Date: 02/21/2022 CLINICAL DATA:  Flank pain, kidney stone suspected Generalized abdominal pain. Nausea, vomiting, diarrhea. Right scrotal pain today. EXAM: CT ABDOMEN AND PELVIS WITHOUT CONTRAST TECHNIQUE: Multidetector CT imaging of the abdomen and pelvis was performed following the standard protocol without IV contrast. RADIATION DOSE REDUCTION: This exam was performed according to the departmental dose-optimization program which includes automated exposure control, adjustment of the mA and/or kV according to patient size and/or use of iterative reconstruction technique. COMPARISON:  Contrast enhanced CT  02/04/2022 FINDINGS: Lower chest: Hypoventilatory atelectasis. No confluent consolidation or pleural effusion. Hepatobiliary: No focal liver abnormality is seen. No gallstones, gallbladder wall thickening, or biliary dilatation. Pancreas: No ductal dilatation or inflammation. Spleen: Normal in size without focal abnormality. Adrenals/Urinary Tract: Normal adrenal glands. No hydronephrosis. No renal calculi. No perinephric edema. The small left renal hypodense lesions on prior contrast-enhanced CT are not seen on the current exam. Decompressed ureters without ureteral stone. The urinary bladder is partially distended, no bladder wall thickening or stone. Stomach/Bowel: Tiny hiatal hernia. Stomach is otherwise unremarkable. No small bowel obstruction or inflammation. The appendix is not seen, appendectomy per medical records. Mild descending and  sigmoid colonic diverticulosis, no diverticulitis. No acute colonic inflammation. Vascular/Lymphatic: Normal caliber abdominal aorta. No portal venous or mesenteric gas. Reproductive: Prostate is unremarkable. Other: No ascites or free air. Small fat containing bilateral inguinal hernias. Musculoskeletal: L5-S1 degenerative disc disease. Left os acetabulum. There are no acute or suspicious osseous abnormalities. IMPRESSION: 1. No renal stones or obstructive uropathy. 2. No acute abnormality in the abdomen/pelvis. 3. Mild colonic diverticulosis without diverticulitis. Electronically Signed   By: Narda Rutherford M.D.   On: 02/21/2022 01:08    Procedures Procedures    Medications Ordered in ED Medications  sodium chloride 0.9 % bolus 500 mL (0 mLs Intravenous Stopped 02/21/22 0220)  ondansetron (ZOFRAN) injection 4 mg (4 mg Intravenous Given 02/21/22 0038)  alum & mag hydroxide-simeth (MAALOX/MYLANTA) 200-200-20 MG/5ML suspension 30 mL (30 mLs Oral Given 02/21/22 0108)  ketorolac (TORADOL) 30 MG/ML injection 30 mg (30 mg Intravenous Given 02/21/22 0108)    ED Course/  Medical Decision Making/ A&P                           Medical Decision Making One year of bloating has not been worked up by GI but told he needs a colonoscopy.  Today developed nausea and vomiting and loos stool at work and wanted an evaluation   Problems Addressed: Nausea and vomiting, unspecified vomiting type:    Details: this is likely viral in nature.  the bloating is a chronic issue and needs a GI work up  Amount and/or Complexity of Data Reviewed Independent Historian: spouse    Details: see above External Data Reviewed: labs and notes.    Details: previous notes reviewed, previous creatinine reviewed Labs: ordered.    Details: all labs reviewed:  normal sodium and potassium elevated glucose and creatinine 1.55 this is a chronic issue back to 2017.  White count elevated 12.3 likely secondary to emesis.  Normal hemoglobin and platelets.  Urine without UTI Radiology: ordered and independent interpretation performed.    Details: negative CT by me  Risk OTC drugs. Prescription drug management. Risk Details: Chronic abdominal pain, needs GI work up.  Vomiting and loose stool is acute and likely viral.  Will start zofran ODT and PPI for ongoing GI issues.  Patient will schedule an appointment with his GI doctor for ongoing issues.  Strict return precautions given.     Final Clinical Impression(s) / ED Diagnoses Final diagnoses:  Nausea and vomiting, unspecified vomiting type   Return for intractable cough, coughing up blood, fevers > 100.4 unrelieved by medication, shortness of breath, intractable vomiting, chest pain, shortness of breath, weakness, numbness, changes in speech, facial asymmetry, abdominal pain, passing out, Inability to tolerate liquids or food, cough, altered mental status or any concerns. No signs of systemic illness or infection. The patient is nontoxic-appearing on exam and vital signs are within normal limits.  I have reviewed the triage vital signs and the  nursing notes. Pertinent labs & imaging results that were available during my care of the patient were reviewed by me and considered in my medical decision making (see chart for details). After history, exam, and medical workup I feel the patient has been appropriately medically screened and is safe for discharge home. Pertinent diagnoses were discussed with the patient. Patient was given return precautions.  Rx / DC Orders ED Discharge Orders          Ordered    omeprazole (PRILOSEC) 20 MG capsule  Daily  02/21/22 0225    ondansetron (ZOFRAN-ODT) 8 MG disintegrating tablet        02/21/22 0225              Doshie Maggi, MD 02/21/22 0236

## 2024-06-12 ENCOUNTER — Other Ambulatory Visit (HOSPITAL_BASED_OUTPATIENT_CLINIC_OR_DEPARTMENT_OTHER): Payer: Self-pay | Admitting: Family Medicine

## 2024-06-12 DIAGNOSIS — Z8249 Family history of ischemic heart disease and other diseases of the circulatory system: Secondary | ICD-10-CM

## 2024-07-19 ENCOUNTER — Inpatient Hospital Stay (HOSPITAL_BASED_OUTPATIENT_CLINIC_OR_DEPARTMENT_OTHER): Admission: RE | Admit: 2024-07-19 | Source: Ambulatory Visit

## 2024-07-26 ENCOUNTER — Inpatient Hospital Stay (HOSPITAL_BASED_OUTPATIENT_CLINIC_OR_DEPARTMENT_OTHER)
Admission: RE | Admit: 2024-07-26 | Discharge: 2024-07-26 | Payer: Self-pay | Attending: Family Medicine | Admitting: Family Medicine

## 2024-07-26 DIAGNOSIS — Z8249 Family history of ischemic heart disease and other diseases of the circulatory system: Secondary | ICD-10-CM
